# Patient Record
Sex: Female | Born: 1975 | Race: White | Hispanic: No | Smoking: Never smoker
Health system: Southern US, Community
[De-identification: ages and names within clinical notes are randomized; demographics above are authoritative.]

## PROBLEM LIST (undated history)

## (undated) HISTORY — PX: INTRAUTERINE DEVICE INSERTION: SHX323

## (undated) HISTORY — PX: UPPER GASTROINTESTINAL ENDOSCOPY: SHX188

---

## 2018-06-02 ENCOUNTER — Emergency Department (HOSPITAL_COMMUNITY)
Admission: EM | Admit: 2018-06-02 | Discharge: 2018-06-02 | Disposition: A | Discharge status: Home / Self Care | Payer: BLUE CROSS/BLUE SHIELD | Attending: Emergency Medicine | Admitting: Emergency Medicine

## 2018-06-02 ENCOUNTER — Other Ambulatory Visit: Payer: Self-pay

## 2018-06-02 ENCOUNTER — Emergency Department (HOSPITAL_COMMUNITY): Payer: BLUE CROSS/BLUE SHIELD

## 2018-06-02 ENCOUNTER — Encounter (HOSPITAL_COMMUNITY): Payer: Self-pay | Admitting: Emergency Medicine

## 2018-06-02 DIAGNOSIS — R0602 Shortness of breath: Secondary | ICD-10-CM | POA: Diagnosis not present

## 2018-06-02 DIAGNOSIS — J189 Pneumonia, unspecified organism: Secondary | ICD-10-CM | POA: Diagnosis not present

## 2018-06-02 DIAGNOSIS — R1031 Right lower quadrant pain: Secondary | ICD-10-CM | POA: Diagnosis not present

## 2018-06-02 DIAGNOSIS — R079 Chest pain, unspecified: Secondary | ICD-10-CM | POA: Diagnosis present

## 2018-06-02 LAB — HEPATIC FUNCTION PANEL
ALT: 15 U/L (ref 0–44)
AST: 19 U/L (ref 15–41)
Albumin: 4 g/dL (ref 3.5–5.0)
Alkaline Phosphatase: 47 U/L (ref 38–126)
Bilirubin, Direct: 0.2 mg/dL (ref 0.0–0.2)
Indirect Bilirubin: 0.8 mg/dL (ref 0.3–0.9)
Total Bilirubin: 1 mg/dL (ref 0.3–1.2)
Total Protein: 7 g/dL (ref 6.5–8.1)

## 2018-06-02 LAB — URINALYSIS, ROUTINE W REFLEX MICROSCOPIC
Bilirubin Urine: NEGATIVE
Glucose, UA: NEGATIVE mg/dL
Hgb urine dipstick: NEGATIVE
KETONES UR: 15 mg/dL — AB
Leukocytes, UA: NEGATIVE
Nitrite: NEGATIVE
Protein, ur: NEGATIVE mg/dL
Specific Gravity, Urine: 1.01 (ref 1.005–1.030)
pH: 8 (ref 5.0–8.0)

## 2018-06-02 LAB — CBC
HCT: 43.8 % (ref 36.0–46.0)
Hemoglobin: 14.5 g/dL (ref 12.0–15.0)
MCH: 31.2 pg (ref 26.0–34.0)
MCHC: 33.1 g/dL (ref 30.0–36.0)
MCV: 94.2 fL (ref 80.0–100.0)
Platelets: 259 10*3/uL (ref 150–400)
RBC: 4.65 MIL/uL (ref 3.87–5.11)
RDW: 11.4 % — AB (ref 11.5–15.5)
WBC: 10.1 10*3/uL (ref 4.0–10.5)
nRBC: 0 % (ref 0.0–0.2)

## 2018-06-02 LAB — I-STAT CHEM 8, ED
BUN: 15 mg/dL (ref 6–20)
Calcium, Ion: 1.08 mmol/L — ABNORMAL LOW (ref 1.15–1.40)
Chloride: 102 mmol/L (ref 98–111)
Creatinine, Ser: 0.7 mg/dL (ref 0.44–1.00)
Glucose, Bld: 108 mg/dL — ABNORMAL HIGH (ref 70–99)
HCT: 42 % (ref 36.0–46.0)
Hemoglobin: 14.3 g/dL (ref 12.0–15.0)
Potassium: 4 mmol/L (ref 3.5–5.1)
Sodium: 138 mmol/L (ref 135–145)
TCO2: 25 mmol/L (ref 22–32)

## 2018-06-02 LAB — BASIC METABOLIC PANEL
Anion gap: 14 (ref 5–15)
BUN: 14 mg/dL (ref 6–20)
CALCIUM: 9.1 mg/dL (ref 8.9–10.3)
CO2: 25 mmol/L (ref 22–32)
CREATININE: 0.84 mg/dL (ref 0.44–1.00)
Chloride: 100 mmol/L (ref 98–111)
GFR calc Af Amer: >60 mL/min (ref >60)
GFR calc non Af Amer: >60 mL/min (ref >60)
Glucose, Bld: 94 mg/dL (ref 70–99)
Potassium: 4.4 mmol/L (ref 3.5–5.1)
Sodium: 139 mmol/L (ref 135–145)

## 2018-06-02 LAB — D-DIMER, QUANTITATIVE: D-Dimer, Quant: 0.66 ug/mL-FEU — ABNORMAL HIGH (ref 0.00–0.50)

## 2018-06-02 LAB — I-STAT BETA HCG BLOOD, ED (MC, WL, AP ONLY)

## 2018-06-02 LAB — LIPASE, BLOOD: Lipase: 27 U/L (ref 11–51)

## 2018-06-02 MED ORDER — IOPAMIDOL (ISOVUE-370) INJECTION 76%
INTRAVENOUS | Status: AC
  Administered 2018-06-02: 100 mL
  Filled 2018-06-02: qty 100

## 2018-06-02 MED ORDER — ONDANSETRON HCL 4 MG/2ML IJ SOLN
4.0000 mg | Freq: Once | INTRAMUSCULAR | Status: AC
  Administered 2018-06-02: 4 mg via INTRAVENOUS
  Filled 2018-06-02: qty 2

## 2018-06-02 MED ORDER — CEFDINIR 300 MG PO CAPS: 300.0000 mg | ORAL_CAPSULE | Freq: Two times a day (BID) | ORAL | 0 refills | Status: AC

## 2018-06-02 MED ORDER — CEFDINIR 300 MG PO CAPS: 300.0000 mg | ORAL_CAPSULE | Freq: Two times a day (BID) | ORAL | Status: DC

## 2018-06-02 MED ORDER — SODIUM CHLORIDE 0.9 % IV BOLUS
1000.0000 mL | Freq: Once | INTRAVENOUS | Status: AC
  Administered 2018-06-02: 1000 mL via INTRAVENOUS

## 2018-06-02 MED ORDER — SODIUM CHLORIDE 0.9 % IV SOLN: 2.0000 g | Freq: Once | INTRAVENOUS | Status: DC

## 2018-06-02 MED ORDER — CEFDINIR 300 MG PO CAPS
300.0000 mg | ORAL_CAPSULE | Freq: Once | ORAL | Status: AC
  Administered 2018-06-02: 300 mg via ORAL
  Filled 2018-06-02 (×2): qty 1

## 2018-06-02 MED ORDER — MORPHINE SULFATE (PF) 4 MG/ML IV SOLN
4.0000 mg | Freq: Once | INTRAVENOUS | Status: AC
  Administered 2018-06-02: 4 mg via INTRAVENOUS
  Filled 2018-06-02: qty 1

## 2018-06-02 NOTE — ED Provider Notes (Signed)
[facility] Note   CSN: [id number] Arrival date & time: 06/02/18  0305     History   Chief Complaint Chief Complaint  Patient presents with  . Post-op Problem    HPI Hannah House is a 42 y.o. female.  Patient presenting with 2 days of chest pain, shortness of breath, vomiting, right lower quadrant pain, incontinence.  Patient had surgery approximately 3 weeks ago for endometrial ablation.  She also had cystoscopy and placement of an IUD.  Patient also reports having blood in her urine.  She endorses some chills at home.  Her vomitus is nonbloody nonbilious.  Her abdominal pain is in the right lower quadrant.  It is worsening.  She has intermittent incontinence.  She was seen by her OB/GYN on Thursday where they did a UA and it was normal.  The OB/GYN and also did a vaginal ultrasound and said that there was still some remaining pelvic fluid that could possibly leading to abdominal irritation.  Patient is not had any hemoptysis.  Patient not had any diarrhea.  Patient says that she has has back pain that radiates down her left leg.  It stops just at the knee.  Patient denies any calf pain bilaterally.  The history is provided by the patient and the spouse.    History reviewed. No pertinent past medical history.  There are no active problems to display for this patient.   Past Surgical History:  Procedure Laterality Date  . INTRAUTERINE DEVICE INSERTION    . UPPER GASTROINTESTINAL ENDOSCOPY       OB History   No obstetric history on file.      Home Medications    Prior to Admission medications   Not on File    Family History History reviewed. No pertinent family history.  Social History Social History   Tobacco Use  . Smoking status: Never Smoker  . Smokeless tobacco: Never Used  Substance Use Topics  . Alcohol use: Yes    Comment: occas  . Drug use: Never     Allergies   Codeine   Review of Systems Review  of Systems  Constitutional: Positive for chills.  Respiratory: Positive for shortness of breath. Negative for wheezing.   Cardiovascular: Positive for chest pain. Negative for leg swelling.  Gastrointestinal: Positive for abdominal pain, nausea and vomiting.  Genitourinary: Positive for hematuria and vaginal discharge. Negative for vaginal bleeding.  All other systems reviewed and are negative.    Physical Exam Updated Vital Signs BP 112/76 (BP Location: Right Arm)   Pulse (!) 106   Temp 98.3 F (36.8 C) (Oral)   Resp 17   Ht 5\' 6"  (1.676 m)   Wt 49.9 kg   SpO2 90%   BMI 17.75 kg/m   Physical Exam Constitutional:      Appearance: Normal appearance.  HENT:     Head: Normocephalic and atraumatic.  Neck:     Musculoskeletal: Normal range of motion. No muscular tenderness.  Cardiovascular:     Rate and Rhythm: Tachycardia present.     Heart sounds: No murmur. No friction rub. No gallop.   Pulmonary:     Effort: Pulmonary effort is normal.     Breath sounds: Normal breath sounds. No wheezing.  Chest:     Chest wall: Tenderness present.  Abdominal:     General: Abdomen is flat.     Palpations: Abdomen is soft.     Tenderness: There is abdominal tenderness in the  right lower quadrant. There is no guarding or rebound.  Musculoskeletal: Normal range of motion.  Skin:    General: Skin is warm and dry.     Capillary Refill: Capillary refill takes less than 2 seconds.  Neurological:     General: No focal deficit present.     Mental Status: She is alert. Mental status is at baseline.  Psychiatric:        Mood and Affect: Mood normal.        Behavior: Behavior normal.        Thought Content: Thought content normal.        Judgment: Judgment normal.      ED Treatments / Results  Labs (all labs ordered are listed, but only abnormal results are displayed) Labs Reviewed  CBC - Abnormal; Notable for the following components:      Result Value   RDW 11.4 (*)    All other  components within normal limits  D-DIMER, QUANTITATIVE (NOT AT [facility]) - Abnormal; Notable for the following components:   D-Dimer, Quant 0.66 (*)    All other components within normal limits  I-STAT CHEM 8, ED - Abnormal; Notable for the following components:   Glucose, Bld 108 (*)    Calcium, Ion 1.08 (*)    All other components within normal limits  BASIC METABOLIC PANEL  HEPATIC FUNCTION PANEL  LIPASE, BLOOD  URINALYSIS, ROUTINE W REFLEX MICROSCOPIC  I-STAT BETA HCG BLOOD, ED ([facility], [facility], [facility] ONLY)    EKG EKG Interpretation  Date/Time:  Saturday June 02 2018 04:30:33 EST Ventricular Rate:  90 PR Interval:    QRS Duration: 123 QT Interval:  373 QTC Calculation: 457 R Axis:   84 Text Interpretation:  Sinus rhythm Borderline short PR interval Consider right atrial enlargement Left bundle branch block Baseline wander in lead(s) V6 No old tracing to compare Confirmed by Melene Plan [phone]) on 06/02/2018 4:37:58 AM   Radiology Dg Chest 2 View  Result Date: 06/02/2018 CLINICAL DATA:  Acute onset of generalized abdominal pain, radiating to the back. Incontinence, nausea and vomiting. EXAM: CHEST - 2 VIEW COMPARISON:  None. FINDINGS: The lungs are well-aerated and clear. There is no evidence of focal opacification, pleural effusion or pneumothorax. The heart is normal in size; the mediastinal contour is within normal limits. No acute osseous abnormalities are seen. IMPRESSION: No acute cardiopulmonary process seen. Electronically Signed   By: Roanna Raider M.D.   On: 06/02/2018 05:17   Ct Angio Chest Pe W And/or Wo Contrast  Result Date: 06/02/2018 CLINICAL DATA:  Acute onset of shortness of breath and generalized chest and abdominal pain. EXAM: CT ANGIOGRAPHY CHEST CT ABDOMEN AND PELVIS WITH CONTRAST TECHNIQUE: Multidetector CT imaging of the chest was performed using the standard protocol during bolus administration of intravenous contrast. Multiplanar CT image reconstructions and MIPs  were obtained to evaluate the vascular anatomy. Multidetector CT imaging of the abdomen and pelvis was performed using the standard protocol during bolus administration of intravenous contrast. CONTRAST:  ISOVUE-370 IOPAMIDOL (ISOVUE-370) INJECTION 76% COMPARISON:  None. FINDINGS: CTA CHEST FINDINGS Cardiovascular:  There is no evidence of pulmonary embolus. The heart is normal in size. The thoracic aorta is unremarkable. The great vessels are within normal limits. Mediastinum/Nodes: The mediastinum is unremarkable in appearance. No mediastinal lymphadenopathy is seen. No pericardial effusion is identified. The visualized portions of the thyroid gland are unremarkable. No axillary lymphadenopathy is appreciated. Lungs/Pleura: Minimal peripheral airspace opacities are noted at the left lung, raising question  for a mild infectious or inflammatory process. No pleural effusion or pneumothorax is seen. No dominant mass is identified. Musculoskeletal: No acute osseous abnormalities are identified. The visualized musculature is unremarkable in appearance. Review of the MIP images confirms the above findings. CT ABDOMEN and PELVIS FINDINGS Hepatobiliary: The liver is unremarkable in appearance. The gallbladder is unremarkable in appearance. The common bile duct remains normal in caliber. Pancreas: The pancreas is within normal limits. Spleen: A 0.7 cm hypodensity is noted at the spleen. Adrenals/Urinary Tract: The adrenal glands are unremarkable in appearance. The kidneys are within normal limits. There is no evidence of hydronephrosis. No renal or ureteral stones are identified. No perinephric stranding is seen. Stomach/Bowel: The stomach is unremarkable in appearance. The small bowel is within normal limits. The appendix is not visualized; there is no evidence for appendicitis. The colon is unremarkable in appearance. Vascular/Lymphatic: The abdominal aorta is unremarkable in appearance. The inferior vena cava is  grossly unremarkable. No retroperitoneal lymphadenopathy is seen. No pelvic sidewall lymphadenopathy is identified. Reproductive: The bladder is mildly distended and grossly unremarkable. The uterus is grossly unremarkable. An intrauterine device is noted in expected position at the fundus of the uterus. Trace free fluid within the pelvis is likely physiologic in nature. No suspicious adnexal masses are seen. Other: No additional soft tissue abnormalities are seen. Musculoskeletal: No acute osseous abnormalities are identified. The visualized musculature is unremarkable in appearance. Review of the MIP images confirms the above findings. IMPRESSION: 1. No evidence of pulmonary embolus. 2. Minimal peripheral airspace opacities at the left lung, raising question for a mild infectious or inflammatory process. 3. 0.7 cm nonspecific hypodensity at the spleen. Electronically Signed   By: Roanna RaiderJeffery  Chang M.D.   On: 06/02/2018 06:31   Ct Abdomen Pelvis W Contrast  Result Date: 06/02/2018 CLINICAL DATA:  Acute onset of shortness of breath and generalized chest and abdominal pain. EXAM: CT ANGIOGRAPHY CHEST CT ABDOMEN AND PELVIS WITH CONTRAST TECHNIQUE: Multidetector CT imaging of the chest was performed using the standard protocol during bolus administration of intravenous contrast. Multiplanar CT image reconstructions and MIPs were obtained to evaluate the vascular anatomy. Multidetector CT imaging of the abdomen and pelvis was performed using the standard protocol during bolus administration of intravenous contrast. CONTRAST:  100mL ISOVUE-370 IOPAMIDOL (ISOVUE-370) INJECTION 76% COMPARISON:  None. FINDINGS: CTA CHEST FINDINGS Cardiovascular:  There is no evidence of pulmonary embolus. The heart is normal in size. The thoracic aorta is unremarkable. The great vessels are within normal limits. Mediastinum/Nodes: The mediastinum is unremarkable in appearance. No mediastinal lymphadenopathy is seen. No pericardial  effusion is identified. The visualized portions of the thyroid gland are unremarkable. No axillary lymphadenopathy is appreciated. Lungs/Pleura: Minimal peripheral airspace opacities are noted at the left lung, raising question for a mild infectious or inflammatory process. No pleural effusion or pneumothorax is seen. No dominant mass is identified. Musculoskeletal: No acute osseous abnormalities are identified. The visualized musculature is unremarkable in appearance. Review of the MIP images confirms the above findings. CT ABDOMEN and PELVIS FINDINGS Hepatobiliary: The liver is unremarkable in appearance. The gallbladder is unremarkable in appearance. The common bile duct remains normal in caliber. Pancreas: The pancreas is within normal limits. Spleen: A 0.7 cm hypodensity is noted at the spleen. Adrenals/Urinary Tract: The adrenal glands are unremarkable in appearance. The kidneys are within normal limits. There is no evidence of hydronephrosis. No renal or ureteral stones are identified. No perinephric stranding is seen. Stomach/Bowel: The stomach is unremarkable in appearance. The  small bowel is within normal limits. The appendix is not visualized; there is no evidence for appendicitis. The colon is unremarkable in appearance. Vascular/Lymphatic: The abdominal aorta is unremarkable in appearance. The inferior vena cava is grossly unremarkable. No retroperitoneal lymphadenopathy is seen. No pelvic sidewall lymphadenopathy is identified. Reproductive: The bladder is mildly distended and grossly unremarkable. The uterus is grossly unremarkable. An intrauterine device is noted in expected position at the fundus of the uterus. Trace free fluid within the pelvis is likely physiologic in nature. No suspicious adnexal masses are seen. Other: No additional soft tissue abnormalities are seen. Musculoskeletal: No acute osseous abnormalities are identified. The visualized musculature is unremarkable in appearance. Review  of the MIP images confirms the above findings. IMPRESSION: 1. No evidence of pulmonary embolus. 2. Minimal peripheral airspace opacities at the left lung, raising question for a mild infectious or inflammatory process. 3. 0.7 cm nonspecific hypodensity at the spleen. Electronically Signed   By: Roanna Raider M.D.   On: 06/02/2018 06:31    Procedures Procedures (including critical care time)  Medications Ordered in ED Medications  cefdinir (OMNICEF) capsule 300 mg (has no administration in time range)  cefdinir (OMNICEF) capsule 300 mg (has no administration in time range)  ondansetron (ZOFRAN) injection 4 mg (4 mg Intravenous Given 06/02/18 0425)  sodium chloride 0.9 % bolus 1,000 mL (0 mLs Intravenous Stopped 06/02/18 0510)  morphine 4 MG/ML injection 4 mg (4 mg Intravenous Given 06/02/18 0519)  iopamidol (ISOVUE-370) 76 % injection (100 mLs  Contrast Given 06/02/18 0603)     Initial Impression / Assessment and Plan / ED Course  I have reviewed the triage vital signs and the nursing notes.  Pertinent labs & imaging results that were available during my care of the patient were reviewed by me and considered in my medical decision making (see chart for details).  Clinical Course as of Jun 02 648  Sat Jun 02, 2018  0344 Pulse Rate(!): 113 [AT]  0451 D-Dimer, Quant(!): 0.66 [AT]    Clinical Course User Index [AT] Garnette Gunner, MD    Patient presenting with 2 days of chest pain, shortness of breath, abdominal pain, vomiting, right abdominal tenderness and also some back pain.  Also has some endorsed hematuria. Patient has persistent tachycardia.  Chest pain is reproducible, although patient has will score of 3 with some pleuritic component to breath.  Unsure if it was due to MSK pain retching, will get obtain d-dimer to rule out PE. Patient also has history of recent endometrial ablation with free standing fluid in setting of new onset RLQ pain. Concern for possible ?SBO due to N&V.  Nephrotheliathis could also cause back pain, N&V, RLQ pain, and hematuria. Depending on D-dimer, will get CT abdomen/pelvis +/- CTA Chest.  Interval update D-dimer mildly elevated 0.66.  Will get CT abdomen pelvis with CTA chest to evaluate for PE and for abdominal pain.  Remainder labs including CBC, CMP within normal limits.  Chest x-ray showed no acute cardiopulmonary disease.  Interval update CT negative for PE, questionable pneumonia.  UA pending.  Will treat for committee acquired pneumonia with Cefdinir. Likely to cover UTI related.   Final Clinical Impressions(s) / ED Diagnoses   Final diagnoses:  Shortness of breath  RLQ abdominal pain  Community acquired pneumonia, unspecified laterality    ED Discharge Orders    None       Garnette Gunner, MD 06/02/18 0650    Melene Plan, DO 06/02/18 0700

## 2018-06-02 NOTE — ED Notes (Signed)
Patient verbalizes understanding of discharge instructions. Opportunity for questioning and answers were provided. Armband removed by staff, pt discharged from ED. Wheeled out into lobby  

## 2018-06-02 NOTE — ED Triage Notes (Signed)
C/O of post op problem stemming from surgery for endometriosis and an endoscopy. Since, pt has had abdominal pain radiating down both legs and around to back. Pt also reports some incontinence, nausea, and emesis. States she had IUD checked and it is in place.

## 2018-06-02 NOTE — Discharge Instructions (Addendum)
CT chest showed that you may have the beginnings of development of pneumonia.  CT abdomen pelvis did not show anything that would signify the cause of your abdominal pain.  Please follow-up with your OB/GYN for further evaluation regarding endometriosis and ongoing abdominal pain.

## 2018-06-02 NOTE — ED Notes (Signed)
Patient transported to CT 

## 2019-12-14 IMAGING — DX DG CHEST 2V
2 series · 2 of 2 positions shown · non-contrast
Comparison: None.

CLINICAL DATA: Acute onset of generalized abdominal pain, radiating
to the back. Incontinence, nausea and vomiting.

EXAM:
CHEST - 2 VIEW

[w chest pa]
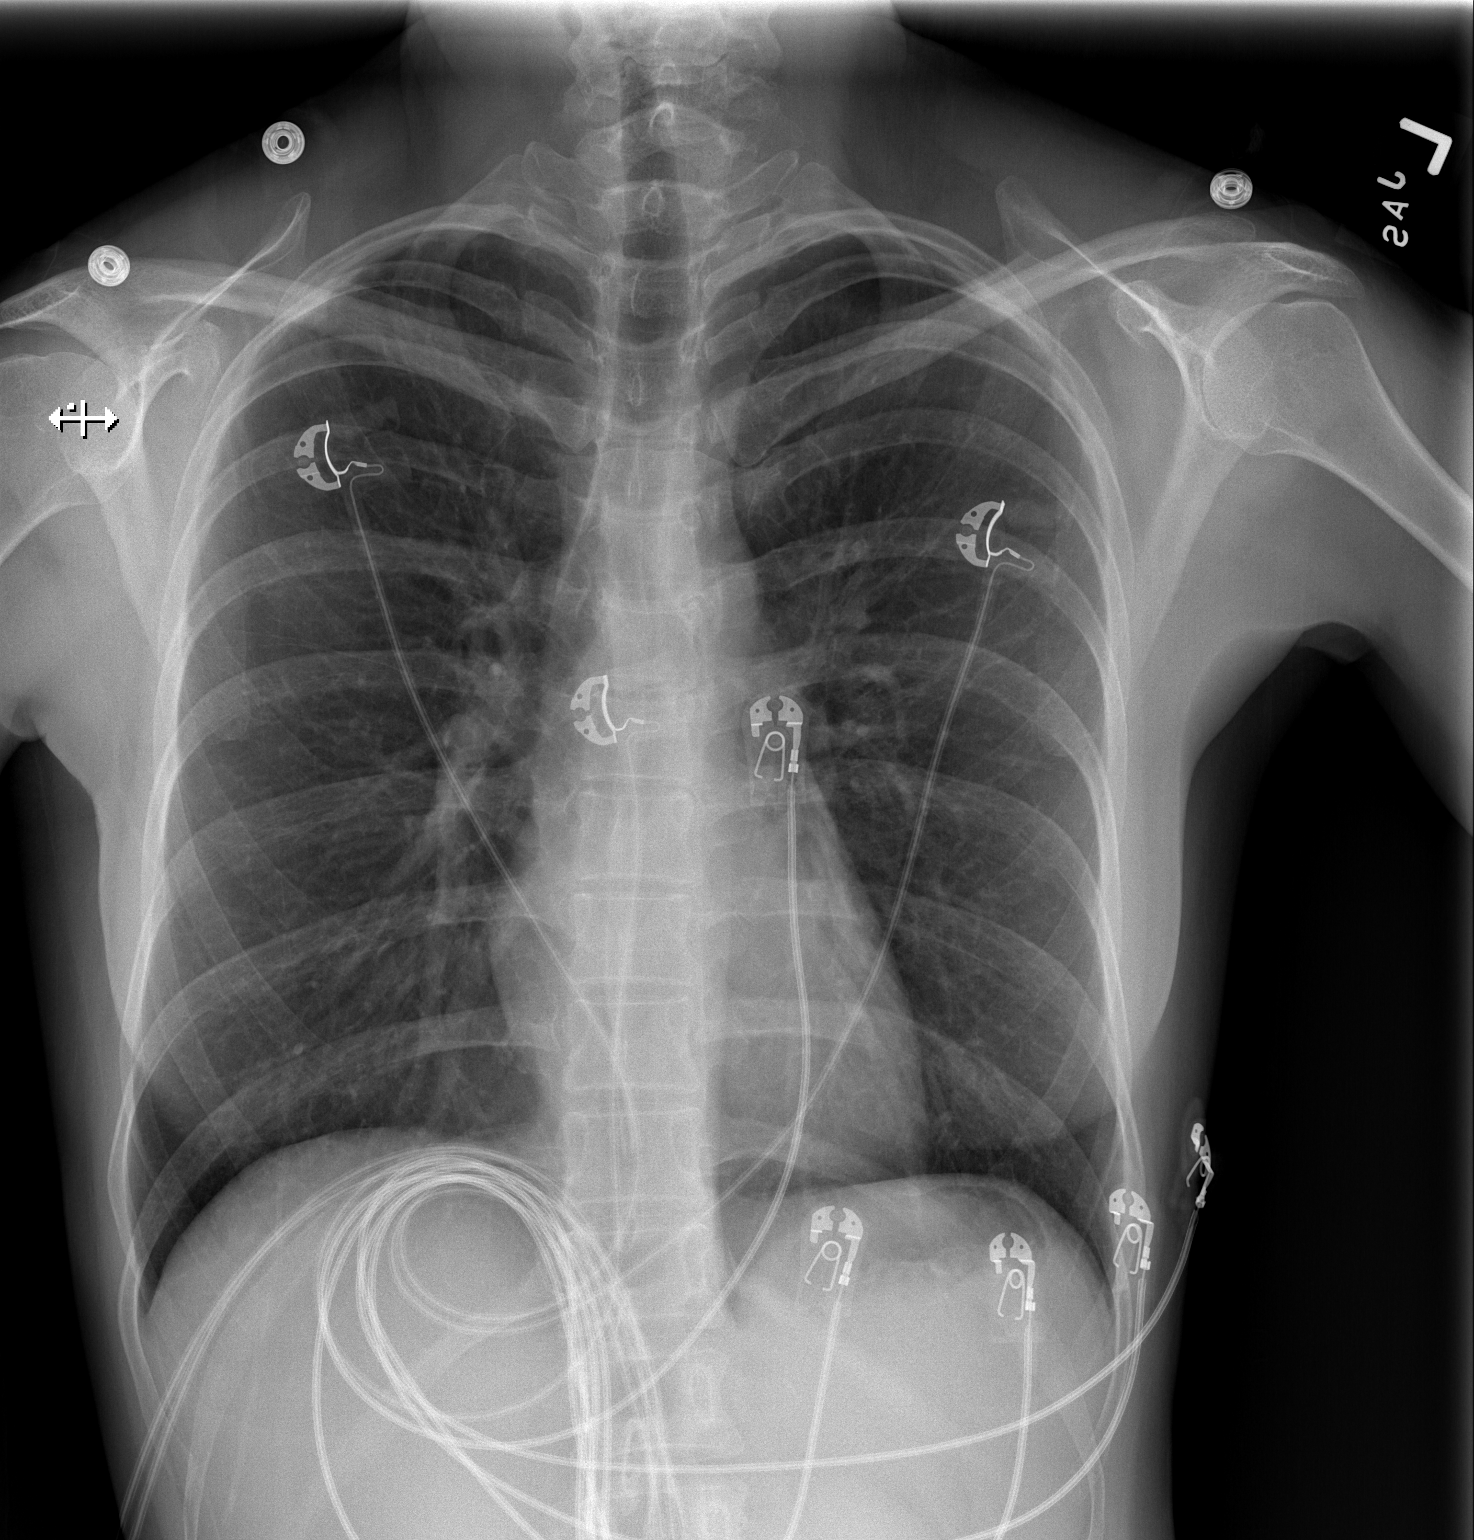

[w chest lat]
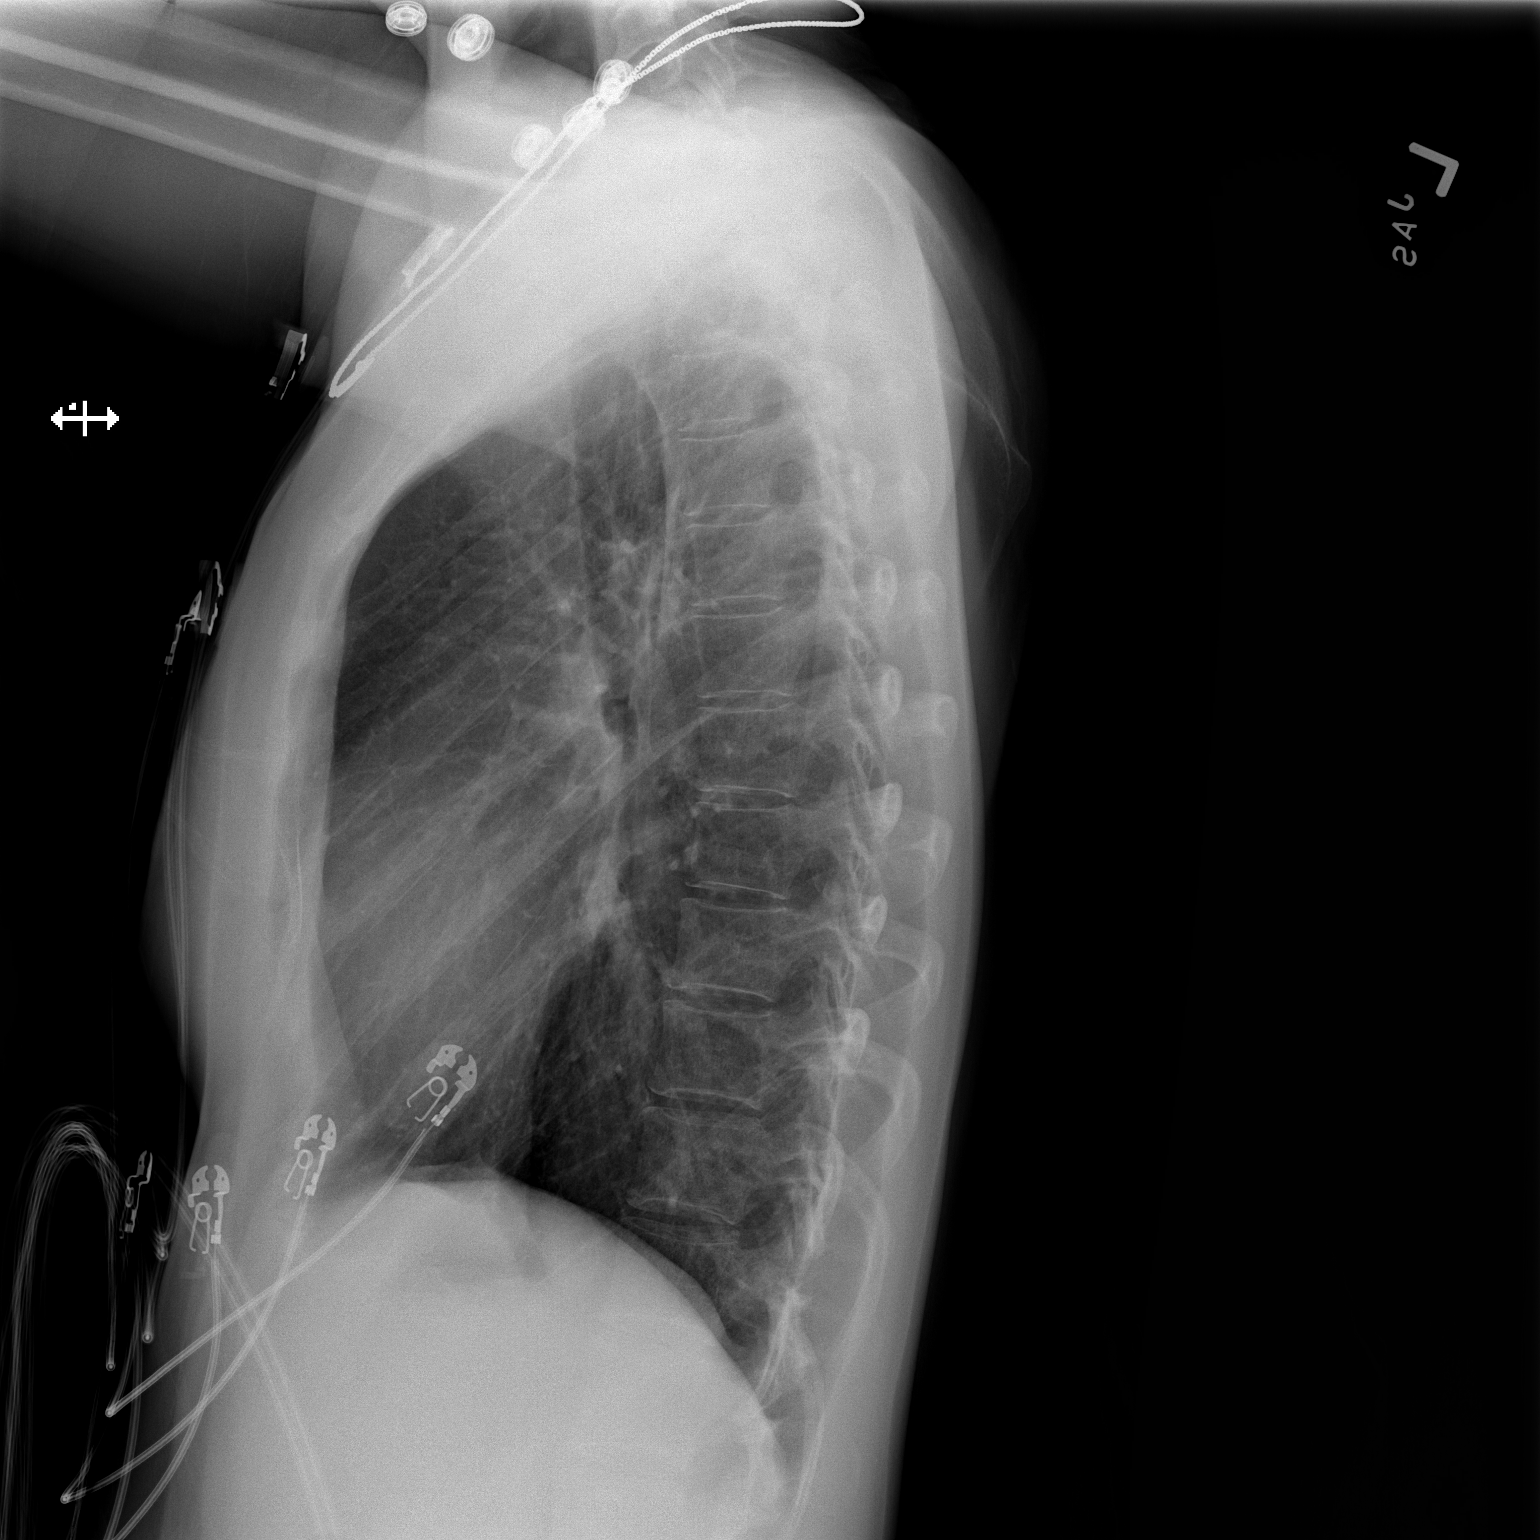

[2 of 2 positions shown; findings below may reference images not displayed]

FINDINGS: The lungs are well-aerated and clear. There is no evidence of focal
opacification, pleural effusion or pneumothorax.

The heart is normal in size; the mediastinal contour is within
normal limits. No acute osseous abnormalities are seen.
IMPRESSION: No acute cardiopulmonary process seen.

## 2019-12-14 IMAGING — CT CT ABD-PELV W/ CM
3 of 12 series · 12 of 46 positions shown, 17 images · IV contrast (APPLIED)
Comparison: None.

CLINICAL DATA: Acute onset of shortness of breath and generalized
chest and abdominal pain.

EXAM:
CT ANGIOGRAPHY CHEST
CT ABDOMEN AND PELVIS WITH CONTRAST
TECHNIQUE: Multidetector CT imaging of the chest was performed using the
standard protocol during bolus administration of intravenous
contrast. Multiplanar CT image reconstructions and MIPs were
obtained to evaluate the vascular anatomy. Multidetector CT imaging
of the abdomen and pelvis was performed using the standard protocol
during bolus administration of intravenous contrast.
CONTRAST:  100mL 2TIFQR-44Q IOPAMIDOL (2TIFQR-44Q) INJECTION 76%

[Series 6: thins · axial · 0.55mm/px · z∈[+502,+771]mm · 8 of 450 slices shown]
[im 33/450  soft-tissue]
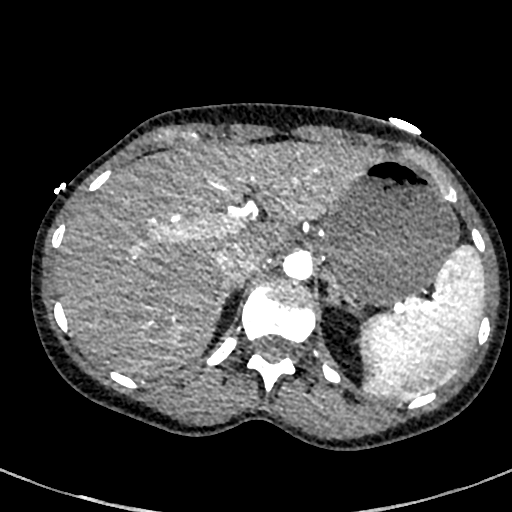
[im 97/450  soft-tissue]
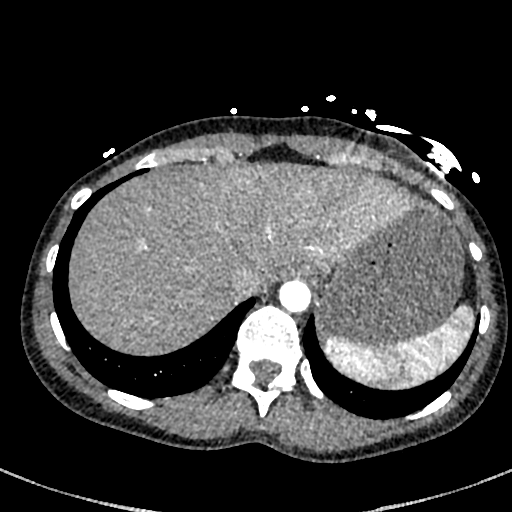
[im 161/450  soft-tissue]
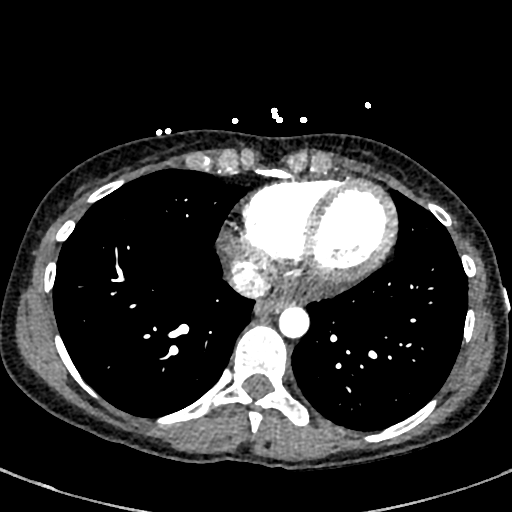
[im 193/450  soft-tissue]
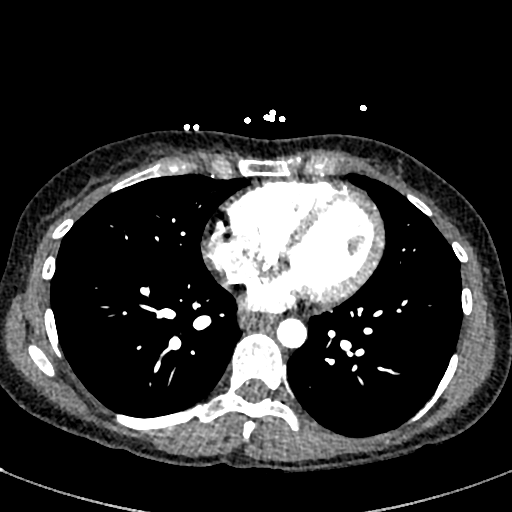
[im 257/450  soft-tissue]
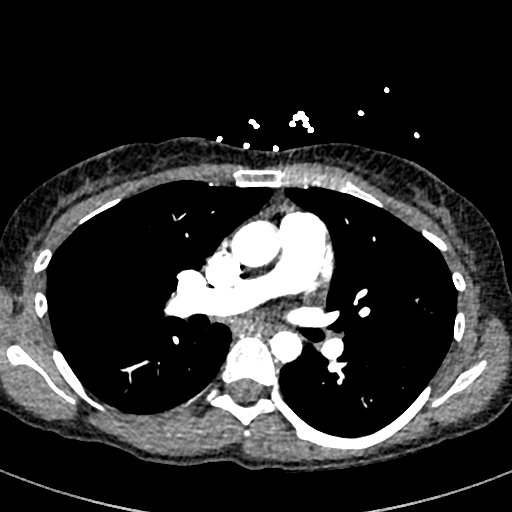
[im 289/450  soft-tissue]
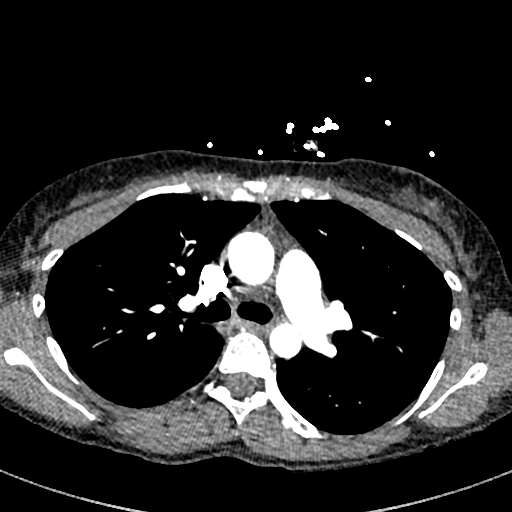
[im 353/450  soft-tissue]
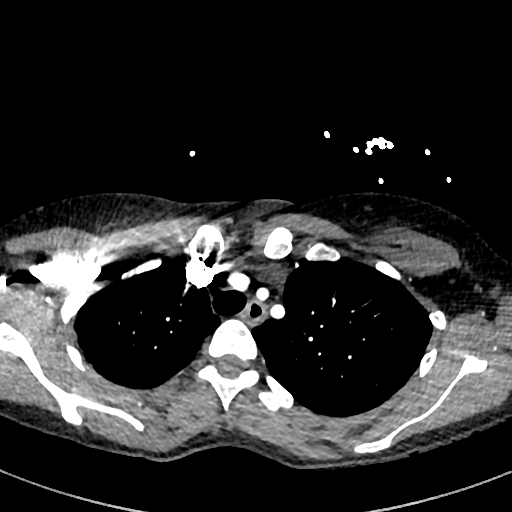
[im 417/450  soft-tissue]
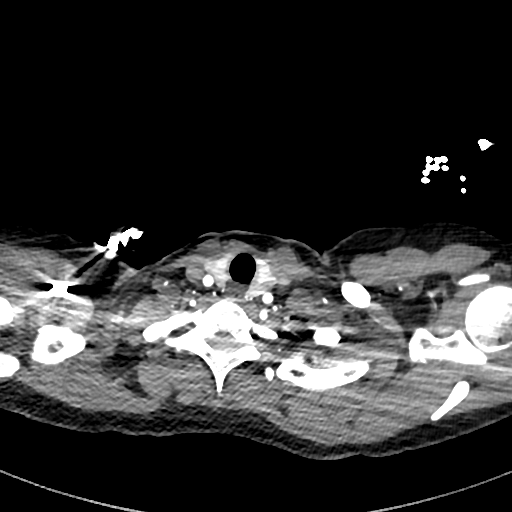

[Series 7: cor · coronal · 0.57mm/px · 1 of 146 slices shown, 2 images]
[im 73/146  soft-tissue]
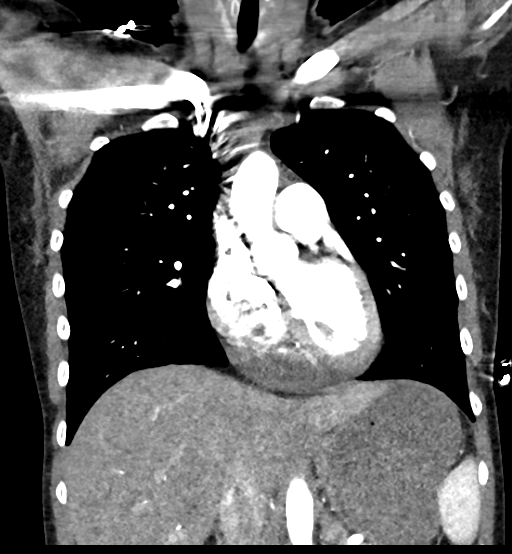
[im 73/146  bone]
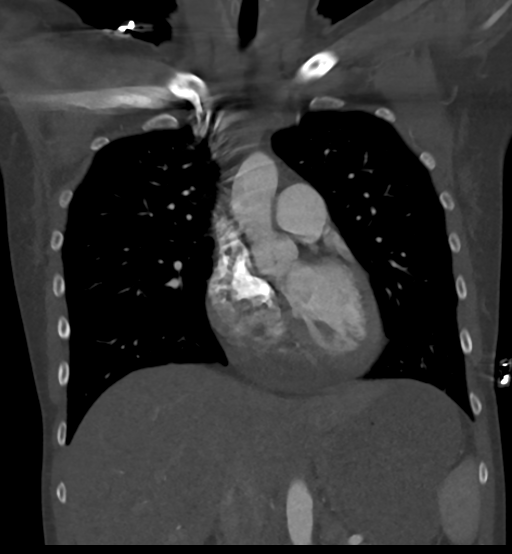

[Series 11: abdomen 5.0 · axial · 0.52mm/px · z∈[+160,+600]mm · 3 of 89 slices shown, 7 images]
[im 1/89  soft-tissue]
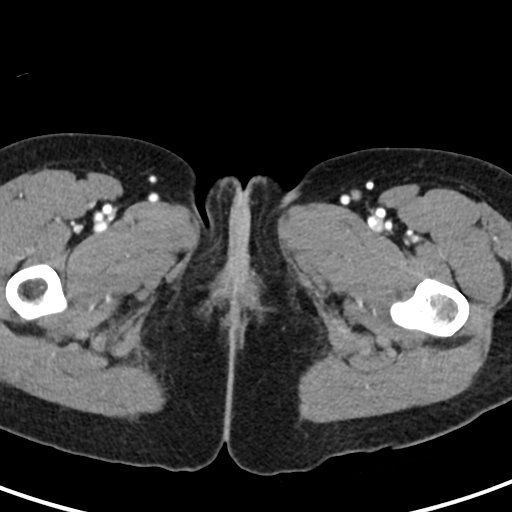
[im 1/89  lung]
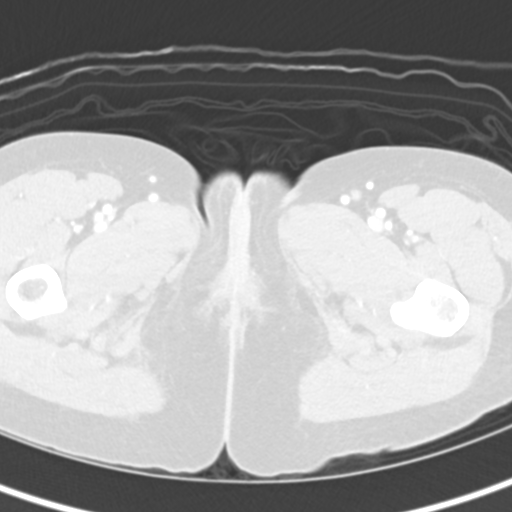
[im 1/89  bone]
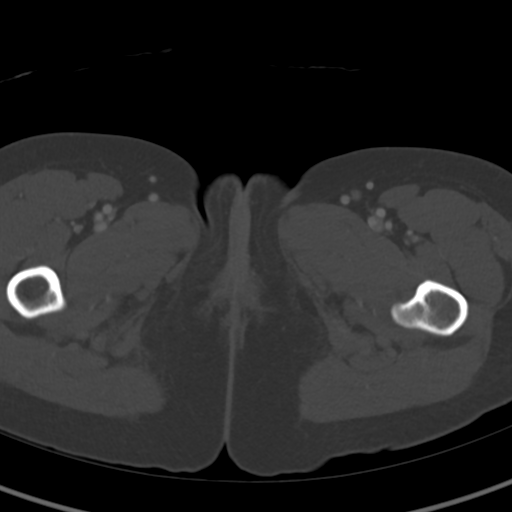
[im 45/89  soft-tissue]
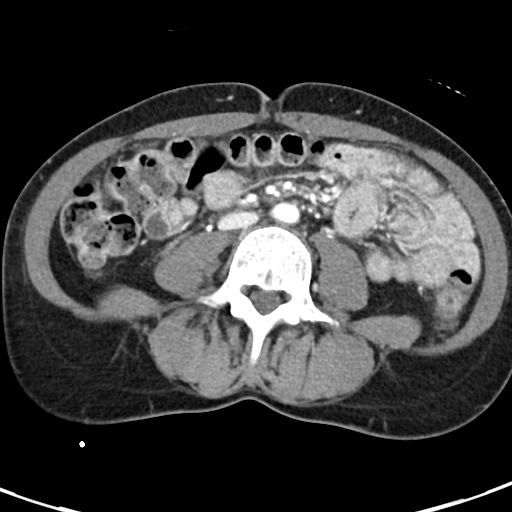
[im 45/89  lung]
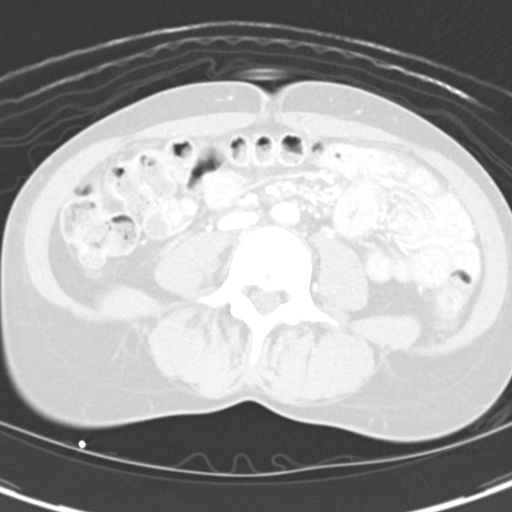
[im 89/89  soft-tissue]
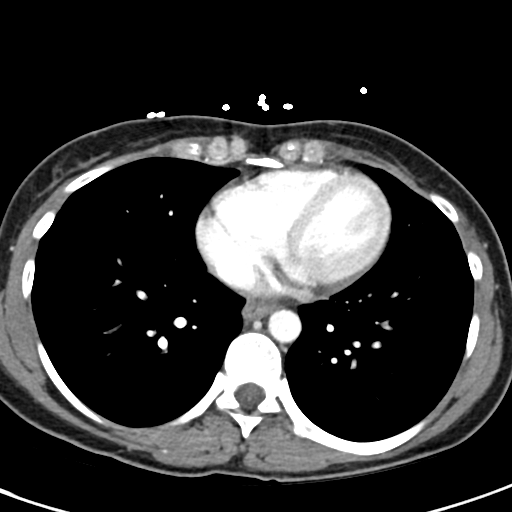
[im 89/89  lung]
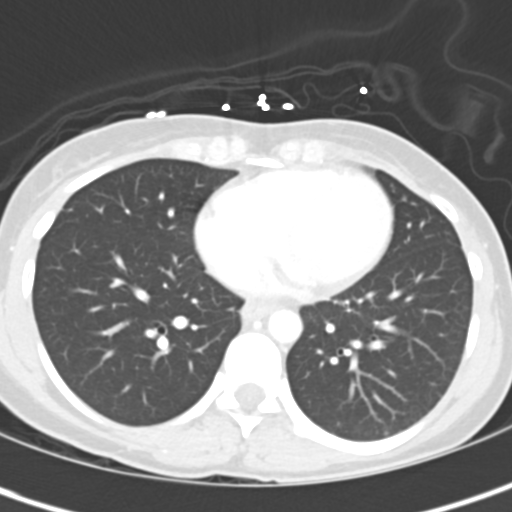

[12 of 46 positions shown; findings below may reference images not displayed]

FINDINGS: CTA CHEST FINDINGS

Cardiovascular:  There is no evidence of pulmonary embolus.

The heart is normal in size. The thoracic aorta is unremarkable. The
great vessels are within normal limits.

Mediastinum/Nodes: The mediastinum is unremarkable in appearance. No
mediastinal lymphadenopathy is seen. No pericardial effusion is
identified. The visualized portions of the thyroid gland are
unremarkable. No axillary lymphadenopathy is appreciated.

Lungs/Pleura: Minimal peripheral airspace opacities are noted at the
left lung, raising question for a mild infectious or inflammatory
process. No pleural effusion or pneumothorax is seen. No dominant
mass is identified.

Musculoskeletal: No acute osseous abnormalities are identified. The
visualized musculature is unremarkable in appearance.

Review of the MIP images confirms the above findings.

CT ABDOMEN and PELVIS FINDINGS

Hepatobiliary: The liver is unremarkable in appearance. The
gallbladder is unremarkable in appearance. The common bile duct
remains normal in caliber.

Pancreas: The pancreas is within normal limits.

Spleen: A 0.7 cm hypodensity is noted at the spleen.

Adrenals/Urinary Tract: The adrenal glands are unremarkable in
appearance. The kidneys are within normal limits. There is no
evidence of hydronephrosis. No renal or ureteral stones are
identified. No perinephric stranding is seen.

Stomach/Bowel: The stomach is unremarkable in appearance. The small
bowel is within normal limits. The appendix is not visualized; there
is no evidence for appendicitis. The colon is unremarkable in
appearance.

Vascular/Lymphatic: The abdominal aorta is unremarkable in
appearance. The inferior vena cava is grossly unremarkable. No
retroperitoneal lymphadenopathy is seen. No pelvic sidewall
lymphadenopathy is identified.

Reproductive: The bladder is mildly distended and grossly
unremarkable. The uterus is grossly unremarkable. An intrauterine
device is noted in expected position at the fundus of the uterus.
Trace free fluid within the pelvis is likely physiologic in nature.
No suspicious adnexal masses are seen.

Other: No additional soft tissue abnormalities are seen.

Musculoskeletal: No acute osseous abnormalities are identified. The
visualized musculature is unremarkable in appearance.

Review of the MIP images confirms the above findings.
IMPRESSION: 1. No evidence of pulmonary embolus.
2. Minimal peripheral airspace opacities at the left lung, raising
question for a mild infectious or inflammatory process.
3. 0.7 cm nonspecific hypodensity at the spleen.
